# Patient Record
Sex: Male | Born: 1950 | Race: White | Hispanic: No | Marital: Married | Smoking: Never smoker
Health system: Southern US, Community
[De-identification: ages and names within clinical notes are randomized; demographics above are authoritative.]

## PROBLEM LIST (undated history)

## (undated) DIAGNOSIS — M1990 Unspecified osteoarthritis, unspecified site: Secondary | ICD-10-CM

## (undated) DIAGNOSIS — T7840XA Allergy, unspecified, initial encounter: Secondary | ICD-10-CM

## (undated) DIAGNOSIS — C61 Malignant neoplasm of prostate: Secondary | ICD-10-CM

## (undated) DIAGNOSIS — K219 Gastro-esophageal reflux disease without esophagitis: Secondary | ICD-10-CM

## (undated) HISTORY — PX: KNEE SURGERY: SHX244

## (undated) HISTORY — PX: PROSTATECTOMY: SHX69

## (undated) HISTORY — DX: Gastro-esophageal reflux disease without esophagitis: K21.9

## (undated) HISTORY — PX: COLONOSCOPY: SHX174

## (undated) HISTORY — PX: FLEXIBLE SIGMOIDOSCOPY: SHX1649

## (undated) HISTORY — DX: Allergy, unspecified, initial encounter: T78.40XA

## (undated) HISTORY — PX: UPPER GASTROINTESTINAL ENDOSCOPY: SHX188

## (undated) HISTORY — PX: POLYPECTOMY: SHX149

## (undated) HISTORY — DX: Malignant neoplasm of prostate: C61

## (undated) HISTORY — PX: UMBILICAL HERNIA REPAIR: SHX196

## (undated) HISTORY — PX: WISDOM TOOTH EXTRACTION: SHX21

---

## 2002-01-24 ENCOUNTER — Encounter: Payer: Self-pay | Admitting: Internal Medicine

## 2002-01-24 ENCOUNTER — Ambulatory Visit (HOSPITAL_COMMUNITY): Admission: RE | Admit: 2002-01-24 | Discharge: 2002-01-24 | Payer: Self-pay | Admitting: Internal Medicine

## 2002-10-30 ENCOUNTER — Ambulatory Visit (HOSPITAL_COMMUNITY): Admission: RE | Admit: 2002-10-30 | Discharge: 2002-10-30 | Payer: Self-pay | Admitting: Surgery

## 2007-06-24 ENCOUNTER — Ambulatory Visit (HOSPITAL_COMMUNITY): Admission: RE | Admit: 2007-06-24 | Discharge: 2007-06-24 | Payer: Self-pay | Admitting: Urology

## 2007-08-11 ENCOUNTER — Inpatient Hospital Stay (HOSPITAL_COMMUNITY): Admission: RE | Admit: 2007-08-11 | Discharge: 2007-08-12 | Payer: Self-pay | Admitting: Urology

## 2007-08-11 ENCOUNTER — Encounter (INDEPENDENT_AMBULATORY_CARE_PROVIDER_SITE_OTHER): Payer: Self-pay | Admitting: Urology

## 2010-02-08 ENCOUNTER — Ambulatory Visit: Payer: Self-pay | Admitting: Family Medicine

## 2010-02-08 DIAGNOSIS — H66009 Acute suppurative otitis media without spontaneous rupture of ear drum, unspecified ear: Secondary | ICD-10-CM | POA: Insufficient documentation

## 2010-02-08 DIAGNOSIS — H6120 Impacted cerumen, unspecified ear: Secondary | ICD-10-CM

## 2010-02-10 ENCOUNTER — Telehealth (INDEPENDENT_AMBULATORY_CARE_PROVIDER_SITE_OTHER): Payer: Self-pay | Admitting: *Deleted

## 2010-06-28 ENCOUNTER — Encounter: Payer: Self-pay | Admitting: Orthopaedic Surgery

## 2010-07-10 NOTE — Assessment & Plan Note (Signed)
Summary: Ear wax build up x 2 wks rm 3   Vital Signs:  Patient Profile:   60 Years Old Male CC:      Ears need to be cleaned Height:     66.5 inches Weight:      160 pounds O2 Sat:      100 % O2 treatment:    Room Air Temp:     97.9 degrees F oral Pulse rate:   83 / minute Pulse rhythm:   regular Resp:     16 per minute BP sitting:   122 / 79  (left arm) Cuff size:   regular  Vitals Entered By: Areta Haber CMA (February 08, 2010 11:51 AM)                  Current Allergies: No known allergies History of Present Illness Chief Complaint: Ears need to be cleaned History of Present Illness:  Subjective:  Patient complains of ears clogged for two weeks without pain; left worse than right  Current Problems: OTITIS MEDIA, SUPPURATIVE, ACUTE, LEFT (ICD-382.00) CERUMEN IMPACTION, BILATERAL (ICD-380.4) FAMILY HISTORY DIABETES 1ST DEGREE RELATIVE (ICD-V18.0)   Current Meds AMOXICILLIN 875 MG TABS (AMOXICILLIN) One by mouth two times a day  REVIEW OF SYSTEMS Constitutional Symptoms      Denies fever, chills, night sweats, weight loss, weight gain, and fatigue.  Eyes       Denies change in vision, eye pain, eye discharge, glasses, contact lenses, and eye surgery. Ear/Nose/Throat/Mouth       Complains of change in hearing and ear pain.      Denies hearing loss/aids, ear discharge, dizziness, frequent runny nose, frequent nose bleeds, sinus problems, sore throat, hoarseness, and tooth pain or bleeding.      Comments: L ear x 2 wks Respiratory       Denies dry cough, productive cough, wheezing, shortness of breath, asthma, bronchitis, and emphysema/COPD.  Cardiovascular       Denies murmurs, chest pain, and tires easily with exhertion.    Gastrointestinal       Denies stomach pain, nausea/vomiting, diarrhea, constipation, blood in bowel movements, and indigestion. Genitourniary       Denies painful urination, kidney stones, and loss of urinary control. Neurological     Denies paralysis, seizures, and fainting/blackouts. Musculoskeletal       Denies muscle pain, joint pain, joint stiffness, decreased range of motion, redness, swelling, muscle weakness, and gout.  Skin       Denies bruising, unusual mles/lumps or sores, and hair/skin or nail changes.  Psych       Denies mood changes, temper/anger issues, anxiety/stress, speech problems, depression, and sleep problems. Other Comments: Pt states he was advised to f/u w/ PCP by [facility] and job - he did not. Pt has not seen PCP for this. Pt states he put drops in last night.   Past History:  Past Medical History: Unremarkable  Past Surgical History: R Knee Hernia Prostatectomy Wisdom Teeth  Family History: Family History Diabetes 1st degree relative  Social History: Married Never Smoked Alcohol use-no Drug use-no Regular exercise-no Smoking Status:  never Drug Use:  no Does Patient Exercise:  no   Objective:  Appearance:  Patient appears healthy, stated age, and in no acute distress  Eyes:  Pupils are equal, round, and reactive to light and accomdation.  Extraocular movement is intact.  Conjunctivae are not inflamed.  Ears:  Canals occluded with cerumin bilaterally.  Post lavage:  canals normal.  Right tympanic membrane normal.  Left tympanic membrane erythematous Nose:  Minimal congestion Pharynx:  Normal  Neck:  Supple.  No adenopathy is present.  No thyromegaly is present  Assessment New Problems: OTITIS MEDIA, SUPPURATIVE, ACUTE, LEFT (ICD-382.00) CERUMEN IMPACTION, BILATERAL (ICD-380.4) FAMILY HISTORY DIABETES 1ST DEGREE RELATIVE (ICD-V18.0)   Plan New Medications/Changes: AMOXICILLIN 875 MG TABS (AMOXICILLIN) One by mouth two times a day  #14 x 0, 02/08/2010, Donna Christen MD  New Orders: Cerumen Impaction Removal [69210] New Patient Level III [99203] Planning Comments:   Begin amoxicillin for otitis media; may use Mucinex D for congestion Follow-up with PCP if not  improving.   The patient and/or caregiver has been counseled thoroughly with regard to medications prescribed including dosage, schedule, interactions, rationale for use, and possible side effects and they verbalize understanding.  Diagnoses and expected course of recovery discussed and will return if not improved as expected or if the condition worsens. Patient and/or caregiver verbalized understanding.  Prescriptions: AMOXICILLIN 875 MG TABS (AMOXICILLIN) One by mouth two times a day  #14 x 0   Entered and Authorized by:   Donna Christen MD   Signed by:   Donna Christen MD on 02/08/2010   Method used:   Print then Give to Patient   RxID:   [id number]   Orders Added: 1)  Cerumen Impaction Removal [73710] 2)  New Patient Level III [62694]

## 2010-07-10 NOTE — Progress Notes (Signed)
  Phone Note Outgoing Call   Call placed by: Lajean Saver RN,  February 10, 2010 12:02 PM Call placed to: Patient Action Taken: Phone Call Completed Summary of Call: Call back: patient states he is feeling well and his hearing is back to normal. He had no other questions or concerns

## 2010-10-21 NOTE — Op Note (Signed)
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   PROCEDURES:  1. Robotic assisted laparoscopic radical prostatectomy (left nerve      sparing).  2. Bilateral laparoscopic pelvic lymphadenectomy.   SURGEON:  Heloise Purpura, MD   ASSISTANT:  Excell Seltzer. Annabell Howells, M.D.   ANESTHESIA:  General.   COMPLICATIONS:  None.   ESTIMATED BLOOD LOSS:  150 mL.   SPECIMENS:  1. Prostate seminal vesicles.  2. Right pelvic lymph nodes.  3. Left pelvic lymph nodes.   DISPOSITION OF SPECIMENS:  To pathology.   DRAINS:  1. A 20-French straight catheter.  2. A #19 Blake pelvic drain.   INDICATIONS:  Mr. Curtis Hamilton is a 60 year old gentleman with clinically  localized adenocarcinoma of the prostate.  After discussion regarding  management options for treatment, the patient elected to proceed with  surgical therapy, and the above procedure.  Potential risks/benefits,  complications, and alternative treatment options were discussed with the  patient in detail and informed consent was obtained.   DESCRIPTION OF PROCEDURE:  The patient was taken to the operating room  and a general anesthetic was administered.  He was given preoperative  antibiotics, placed in the dorsal lithotomy position, and prepped and  draped in the usual sterile fashion.   Next, a preoperative time-out was performed.  A Foley catheter was then  inserted into the bladder, and a site was selected just to the left of  the  umbilicus for placement of the camera port.  This was placed using a  standard open Hassan technique.  This allowed entry into the peritoneal  cavity under direct vision and without difficulty.  A 0-degree lens was  then used to inspect the abdomen. There was no evidence for any intra-  abdominal injuries or other abnormalities.  The remaining ports were  then placed.  Bilateral 8-mm robotic ports were placed 10 cm lateral to  and just inferior to the camera port site.  An additional 8-mm robotic  port was placed in the far left lateral abdominal wall.  A 5-mm port was  placed between the camera port and the right robotic port.  An  additional 12-mm port was placed in the far right lateral abdominal wall  for laparoscopic assistance.  All ports were placed under direct vision  and without difficulty.   The surgical cart was then docked.  With the aid of the cautery  scissors, the bladder was reflected posteriorly allowing entry into  space of Retzius, and identification of the endopelvic fascia.  The  patient's mesh hernia repair was encountered, and the mesh extended  from  the pubic symphysis across the left side of the pelvis.  The bladder was  able to be easily dissected off the mesh for the most part, although  there were some adhesions toward the left lateral pelvic sidewall.  Once  the bladder was adequately mobilized, the endopelvic fascia was incised  from the apex back to the base of the prostate bilaterally, and the  underlying levator muscle fibers were swept laterally off the prostate.  This isolated the dorsal venous complex which was then stapled and  divided with a 45-mm Flexi-ETS stapler.  The bladder neck was identified  with the aid of Foley catheter manipulation and divided anteriorly.  The  Foley catheter balloon was deflated, and the catheter was brought into  the operative field and used to retract the prostate anteriorly.  This  exposed the posterior bladder neck,  and there was noted to be a small  median lobe.  This was enucleated and excised, and dissection proceeded  posteriorly between the bladder and prostate until the vasa deferentia  and seminal vesicles were identified.   The vasa deferentia were isolated, divided, and lifted anteriorly.  The  seminal vesicles were dissected down to their tips with care to control  the seminal vesicle and arterial blood supply.  The space Denonvilliers  fascia and the anterior rectum was then bluntly developed, and the  vascular pedicles of the prostate were thereby isolated.  The lateral  prostatic fascia on the left side of the prostate was sharply incised  allowing the neurovascular bundle to be released.  The vascular pedicle  of the prostate on the left side was then ligated with Hem-o-lok clips  above the level of the neurovascular bundle, and divided with sharp cold  scissor dissection.  The neurovascular bundle was then swept off the  apex of prostate on the left side.   On the right side, a wide non-nerve sparing procedure was performed.  Hem-o-lok clips were again used for ligation of the vascular pedicles.  The urethra was then sharply divided, allowing the specimen to be  disarticulated.  The pelvis was copiously irrigated.  There were noted  to be 2 areas along the left neurovascular bundle that did appear to  have some bleeding which was controlled with 3-0 Vicryl figure-of-eight  sutures.  There was no evidence for a rectal injury.   Attention then turned to the right pelvic sidewall and the fibrofatty  tissue between the external iliac vein, confluence of the iliac vessels,  hypogastric artery, and Cooper's ligament was dissected free from the  pelvic sidewall with care to preserve the obturator nerve.  Hem-o-lok  clips were used for lymphostasis and hemostasis.  This specimen was  passed off for permanent pathologic analysis.   On the left side an identical procedure was performed.  A  more limited  lymph node dissection; however, was performed due to the adhesions that  were noted as a result the patient's prior left mesh inguinal hernia  repair.  The specimen was passed off for permanent pathologic analysis,  and attention turned to the urethral anastomosis.  A 2-0 Vicryl slip-  knot was placed between the Denonvilliers fascia, the posterior urethra,  and the posterior bladder neck to reapproximate these structures.  A  double-armed 3-0 Monocryl suture was then used to perform a 360-degree  running, tension-free anastomosis.  A new 20-French Coude catheter was  inserted into the bladder and irrigated.  There were no blood clots  within the  bladder, and the anastomosis appeared be watertight.  A #19  Blake drain was brought through the left robotic port and appropriately  positioned in the pelvis.  It was secured to the skin with a nylon  suture.   The surgical cart was then undocked.  The right lateral 12-mm port site  was closed with a #0 Vicryl suture placed with the aid of the [employer].  All remaining ports were removed under direct vision, and the  prostate specimen was removed intact within the Endopouch retrieval bag  via the periumbilical port site.  This port site was then closed at the  fascial level with a running #0 Vicryl suture.  All port sites were  injected with 1/4% Marcaine and reapproximated at the skin level.  Sterile dressings were applied.  The patient appeared to have tolerated  the procedure well without complications.  He was able to be extubated,  and transferred to the recovery unit in satisfactory condition.      Heloise Purpura, MD  Electronically Signed     LB/MEDQ  D:  08/11/2007  T:  08/11/2007  Job:  [id number]

## 2010-10-24 NOTE — Op Note (Signed)
NAME:  Curtis Hamilton, Curtis Hamilton                         ACCOUNT NO.:  [id number]   MEDICAL RECORD NO.:  [id number]                   PATIENT TYPE:  AMB   LOCATION:  DAY                                  FACILITY:  [facility]   PHYSICIAN:  Abigail Miyamoto, M.D.              DATE OF BIRTH:  07/23/1950   DATE OF PROCEDURE:  10/30/2002  DATE OF DISCHARGE:                                 OPERATIVE REPORT   PREOPERATIVE DIAGNOSES:  1. Left inguinal hernia.  2. Umbilical hernia.   POSTOPERATIVE DIAGNOSES:  1. Left inguinal hernia.  2. Umbilical hernia.   OPERATION/PROCEDURE:  1. Laparoscopic repair of left inguinal hernia with mesh.  2. Umbilical hernia repair.   SURGEON:  Abigail Miyamoto, M.D.   ANESTHESIA:  General endotracheal anesthesia with 0.25% Marcaine   ESTIMATED BLOOD LOSS:  Minimal.   DESCRIPTION OF PROCEDURE:  The patient was brought to the operating room and  identified as Curtis Hamilton and he was placed supine on the operating table  and general anesthesia was induced.  His abdomen was then prepped and draped  in the usual sterile fashion.  Using a #15 blade, a small transverse  incision was made below the umbilicus.  The incision was carried down to the  fascia which was then opened with a scalpel.  The rectus muscle was then  identified and elevated.  The dissecting balloon was then passed underneath  the rectus muscle and manipulated toward the pubis.  The dissecting balloon  was then insufflated dissecting out the preperitoneal space. This was done  under direct vision.  At this point the dissecting balloon was removed and a  small port was placed.  Insufflation was then begun with carbon dioxide in  the preperitoneal space.  Next, two 5 mm ports were placed in the patient's  midline under direct vision.  The patient's inguinal area was easily  identified as well as the testicular cord structures, the epigastric vessels  and Cooper's ligament.  The patient was found to have  a small direct  inguinal hernia.  The testicular cord was examined and no indirect hernia  defect was identified.  At this point a piece of precut Prolene mesh from  [place] was brought to the field.  The mesh was placed through the port at the  umbilicus.  The mesh was then placed in overlay fashion of the testicular  cord structures as well as the hernia defect.  The mesh was then tacked in  place with a surgical tacker, tacking it to Cooper's ligament up the medial  abdominal wall and out laterally.  Excellent coverage of the hernia defect  appeared to be achieved.  At this point all ports were removed and the  preperitoneal spaces deflated.  The mesh appeared to lay appropriately as  the space collapsed.   Next, attention was turned toward the umbilical hernia.  Through the same  incision the hernia  sac was easily identified and separated from the  overlying umbilical skin.  The sac was then excised with the electrocautery.  The fascial defect was then closed with several figure-of-eight #1 Novofil  sutures.  __________ small fascial incision for the laparoscopic repair was  then closed with 0 Vicryl figure-of-eight suture as well.   At this point all incisions were anesthetized with 0.25% Marcaine and closed  with 4-0 Monocryl subcuticular sutures.  Steri-Strips, gauze and tape were  applied.  The patient tolerated the procedure well.  All sponge, needle and  instrument counts were correct at the end of the procedure.  The patient was  then extubated in the operating room and taken in stable condition to the  recovery room.                                               Abigail Miyamoto, M.D.    DB/MEDQ  D:  10/30/2002  T:  10/30/2002  Job:  [id number]

## 2011-02-27 LAB — CBC
HCT: 44.5
Hemoglobin: 15.5
RDW: 13.2

## 2011-02-27 LAB — BASIC METABOLIC PANEL
BUN: 15
CO2: 31
Calcium: 9.5
Chloride: 103
Creatinine, Ser: 0.94
GFR calc Af Amer: >60
GFR calc non Af Amer: >60
Glucose, Bld: 112 — ABNORMAL HIGH
Potassium: 4.6
Sodium: 140

## 2011-03-02 LAB — HEMOGLOBIN AND HEMATOCRIT, BLOOD
HCT: 38.6 — ABNORMAL LOW
Hemoglobin: 12.9 — ABNORMAL LOW

## 2011-03-02 LAB — TYPE AND SCREEN: Antibody Screen: NEGATIVE

## 2012-04-25 ENCOUNTER — Other Ambulatory Visit: Payer: Self-pay | Admitting: Gastroenterology

## 2012-10-24 ENCOUNTER — Encounter (HOSPITAL_BASED_OUTPATIENT_CLINIC_OR_DEPARTMENT_OTHER): Payer: Self-pay

## 2012-10-24 ENCOUNTER — Emergency Department (HOSPITAL_BASED_OUTPATIENT_CLINIC_OR_DEPARTMENT_OTHER)
Admission: EM | Admit: 2012-10-24 | Discharge: 2012-10-24 | Disposition: A | Discharge status: Home / Self Care | Payer: BC Managed Care – PPO | Attending: Emergency Medicine | Admitting: Emergency Medicine

## 2012-10-24 DIAGNOSIS — M533 Sacrococcygeal disorders, not elsewhere classified: Secondary | ICD-10-CM | POA: Insufficient documentation

## 2012-10-24 DIAGNOSIS — M461 Sacroiliitis, not elsewhere classified: Secondary | ICD-10-CM | POA: Insufficient documentation

## 2012-10-24 DIAGNOSIS — Z8739 Personal history of other diseases of the musculoskeletal system and connective tissue: Secondary | ICD-10-CM | POA: Insufficient documentation

## 2012-10-24 HISTORY — DX: Unspecified osteoarthritis, unspecified site: M19.90

## 2012-10-24 MED ORDER — PANTOPRAZOLE SODIUM 40 MG PO TBEC
40.0000 mg | DELAYED_RELEASE_TABLET | Freq: Once | ORAL | Status: AC
  Administered 2012-10-24: 40 mg via ORAL
  Filled 2012-10-24: qty 1

## 2012-10-24 MED ORDER — IBUPROFEN 800 MG PO TABS: ORAL_TABLET | ORAL | Status: DC

## 2012-10-24 MED ORDER — OXYCODONE-ACETAMINOPHEN 10-325 MG PO TABS: 1.0000 | ORAL_TABLET | ORAL | Status: DC | PRN

## 2012-10-24 MED ORDER — IBUPROFEN 800 MG PO TABS
800.0000 mg | ORAL_TABLET | Freq: Once | ORAL | Status: AC
  Administered 2012-10-24: 800 mg via ORAL
  Filled 2012-10-24: qty 1

## 2012-10-24 NOTE — ED Provider Notes (Signed)
History     CSN: [id number]  Arrival date & time 10/24/12  7846   First MD Initiated Contact with Patient 10/24/12 0423      Chief Complaint  Patient presents with  . Back Pain    (Consider location/radiation/quality/duration/timing/severity/associated sxs/prior treatment) HPI This is a 62 year old man who did a lot of hiking and climbing 3 days ago. He is here with 2 days of pain originating in his right sacroiliac joint and radiating around to his right groin. He has not noted any groin masses or scrotal masses. He has not had any abdominal pain. He denies any dark urine or urinary changes. He denies any numbness or weakness. He denies any falls or other frank trauma. The pain is moderate and worse with ambulation. He has had partial relief with over-the-counter ibuprofen.  Past Medical History  Diagnosis Date  . Arthritis     Past Surgical History  Procedure Laterality Date  . Prostatectomy      No family history on file.  History  Substance Use Topics  . Smoking status: Never Smoker   . Smokeless tobacco: Not on file  . Alcohol Use: Not on file      Review of Systems  All other systems reviewed and are negative.    Allergies  Skelaxin  Home Medications   Current Outpatient Rx  Name  Route  Sig  Dispense  Refill  . ibuprofen (ADVIL,MOTRIN) 200 MG tablet   Oral   Take 400 mg by mouth every 6 (six) hours as needed for pain.           BP 156/94  Pulse 85  Temp(Src) 98.8 F (37.1 C) (Oral)  Resp 16  SpO2 98%  Physical Exam General: Well-developed, well-nourished male in no acute distress; appearance consistent with age of record HENT: normocephalic, atraumatic Eyes: pupils equal round and reactive to light; extraocular muscles intact Neck: supple Heart: regular rate and rhythm Lungs: clear to auscultation bilaterally Abdomen: soft; nondistended; nontender; no masses or hepatosplenomegaly; bowel sounds present GU: Tanner 4 male, circumcised; no  testicular masses or tenderness; no hernias palpated Back: Mild right SI tenderness Extremities: No deformity; full range of motion Neurologic: Awake, alert and oriented; motor function intact in all extremities and symmetric; no facial droop; mildly antalgic gait Skin: Warm and dry Psychiatric: Normal mood and affect    ED Course  Procedures (including critical care time)     MDM          Hanley Seamen, MD 10/24/12 [phone]

## 2012-10-24 NOTE — ED Notes (Signed)
Patient here with 2 days of right lower back pain with radiation to groin. States that the discomfort started after trip to the mountains with hiking. Has been taking ibuprofen with some relief. Pain worse with ambulation

## 2012-10-24 NOTE — ED Notes (Signed)
MD at bedside. 

## 2015-01-31 ENCOUNTER — Encounter: Payer: Self-pay | Admitting: Emergency Medicine

## 2015-01-31 ENCOUNTER — Emergency Department
Admission: EM | Admit: 2015-01-31 | Discharge: 2015-01-31 | Disposition: A | Discharge status: Home / Self Care | Payer: BC Managed Care – PPO | Source: Home / Self Care | Attending: Family Medicine | Admitting: Family Medicine

## 2015-01-31 DIAGNOSIS — H6123 Impacted cerumen, bilateral: Secondary | ICD-10-CM

## 2015-01-31 MED ORDER — NEOMYCIN-POLYMYXIN-HC 3.5-10000-1 OT SUSP: 4.0000 [drp] | Freq: Three times a day (TID) | OTIC | Status: DC

## 2015-01-31 NOTE — ED Notes (Signed)
Bi-lateral cerumen  impaction 

## 2015-01-31 NOTE — Discharge Instructions (Signed)
° °  Ear Drops You have been diagnosed with a condition requiring you to put drops of medicine into your outer ear. HOME CARE INSTRUCTIONS   Put drops in the affected ear as instructed. After putting the drops in, you will need to lie down with the affected ear facing up for ten minutes so the drops will remain in the ear canal and run down and fill the canal. Continue using the ear drops for as long as directed by your health care provider.  Prior to getting up, put a cotton ball gently in your ear canal. Leave enough of the cotton ball out so it can be easily removed. Do not attempt to push this down into the canal with a cotton-tipped swab or other instrument.  Do not irrigate or wash out your ears if you have had a perforated eardrum or mastoid surgery, or unless instructed to do so by your health care provider.  Keep appointments with your health care provider as instructed.  Finish all medicine, or use for the length of time prescribed by your health care provider. Continue the drops even if your problem seems to be doing well after a couple days, or continue as instructed. SEEK MEDICAL CARE IF:  You become worse or develop increasing pain.  You notice any unusual drainage from your ear (particularly if the drainage has a bad smell).  You develop hearing difficulties.  You experience a serious form of dizziness in which you feel as if the room is spinning, and you feel nauseated (vertigo).  The outside of your ear becomes red or swollen or both. This may be a sign of an allergic reaction. MAKE SURE YOU:   Understand these instructions.  Will watch your condition.  Will get help right away if you are not doing well or get worse. Document Released: 05/19/2001 Document Revised: 05/30/2013 Document Reviewed: 12/20/2012 [facility] Patient Information 2015 [place], [place]. This information is not intended to replace advice given to you by your health care provider. Make sure you discuss  any questions you have with your health care provider.    Cerumen Impaction A cerumen impaction is when the wax in your ear forms a plug. This plug usually causes reduced hearing. Sometimes it also causes an earache or dizziness. Removing a cerumen impaction can be difficult and painful. The wax sticks to the ear canal. The canal is sensitive and bleeds easily. If you try to remove a heavy wax buildup with a cotton tipped swab, you may push it in further. Irrigation with water, suction, and small ear curettes may be used to clear out the wax. If the impaction is fixed to the skin in the ear canal, ear drops may be needed for a few days to loosen the wax. People who build up a lot of wax frequently can use ear wax removal products available in your local drugstore. SEEK MEDICAL CARE IF:  You develop an earache, increased hearing loss, or marked dizziness. Document Released: 07/02/2004 Document Revised: 08/17/2011 Document Reviewed: 08/22/2009 [facility] Patient Information 2015 [place], [place]. This information is not intended to replace advice given to you by your health care provider. Make sure you discuss any questions you have with your health care provider.

## 2015-01-31 NOTE — ED Provider Notes (Signed)
CSN: [id number]     Arrival date & time 01/31/15  1907 History   First MD Initiated Contact with Patient 01/31/15 1937     Chief Complaint  Patient presents with  . Cerumen Impaction      HPI Comments: Patient complains of recurrent sensation of ears clogged, left worse  The history is provided by the patient.    Past Medical History  Diagnosis Date  . Arthritis    Past Surgical History  Procedure Laterality Date  . Prostatectomy     No family history on file. Social History  Substance Use Topics  . Smoking status: Never Smoker   . Smokeless tobacco: Not on file  . Alcohol Use: Not on file    Review of Systems No sore throat No cough No pleuritic pain No wheezing No nasal congestion No post-nasal drainage No sinus pain/pressure No itchy/red eyes ? Earache; ears feel clogged No hemoptysis No SOB No fever/chills No nausea No vomiting No abdominal pain No diarrhea No urinary symptoms No skin rash No fatigue No myalgias No headache    Allergies  Skelaxin  Home Medications   Prior to Admission medications   Medication Sig Start Date End Date Taking? Authorizing Provider  ibuprofen (ADVIL,MOTRIN) 200 MG tablet Take 400 mg by mouth every 6 (six) hours as needed for pain.    Historical Provider, MD  ibuprofen (ADVIL,MOTRIN) 800 MG tablet Take 1 tablet every 8 hours as needed for back pain. Best taken with a meal. 10/24/12   Shanon Rosser, MD  oxyCODONE-acetaminophen (PERCOCET) 10-325 MG per tablet Take 1 tablet by mouth every 4 (four) hours as needed for pain. 10/24/12   Shanon Rosser, MD   Meds Ordered and Administered this Visit  Medications - No data to display  There were no vitals taken for this visit. No data found.   Physical Exam Nursing notes and Vital Signs reviewed. Appearance:  Patient appears stated age, and in no acute distress Eyes:  Pupils are equal, round, and reactive to light and accomodation.  Extraocular movement is intact.  Conjunctivae  are not inflamed  Nose:  Normal Ears:  Right canal almost completely occluded with cerumen.  Left canal completely occluded with cerumen.  Post lavage by nurse, canals appear mildly erythematous, otherwise normal.  Right tympanic membrane normal.  Left tympanic membrane has halo of erythema at periphery. Skin:  No rash present.   ED Course  Procedures  none       Lab Review: Tympanogram:  Normal both ears     MDM   1. Cerumen impaction, bilateral    Begin Cortisporin Otic Suspension for 4 to 5 days. Followup with Family Doctor if not improved in one week.      Kandra Nicolas, MD 02/02/15 1113

## 2017-10-19 ENCOUNTER — Other Ambulatory Visit: Payer: Self-pay | Admitting: Internal Medicine

## 2017-10-19 DIAGNOSIS — R748 Abnormal levels of other serum enzymes: Secondary | ICD-10-CM

## 2017-10-19 DIAGNOSIS — Z0000 Encounter for general adult medical examination without abnormal findings: Secondary | ICD-10-CM

## 2019-03-20 ENCOUNTER — Ambulatory Visit
Admission: RE | Admit: 2019-03-20 | Discharge: 2019-03-20 | Disposition: A | Discharge status: Home / Self Care | Payer: Medicare Other | Source: Ambulatory Visit | Attending: Internal Medicine | Admitting: Internal Medicine

## 2019-03-20 ENCOUNTER — Other Ambulatory Visit: Payer: Self-pay | Admitting: Internal Medicine

## 2019-03-20 DIAGNOSIS — M25571 Pain in right ankle and joints of right foot: Secondary | ICD-10-CM

## 2019-04-10 ENCOUNTER — Ambulatory Visit: Payer: Self-pay | Admitting: Cardiology

## 2019-05-08 DIAGNOSIS — Z7189 Other specified counseling: Secondary | ICD-10-CM | POA: Insufficient documentation

## 2019-05-08 NOTE — Progress Notes (Signed)
Cardiology Office Note   Date:  05/09/2019   ID:  Curtis Hamilton, DOB February 07, 1951, MRN [id number]  PCP:  Shon Baton, MD  Cardiologist:   No primary care provider on file. Referring:  Shon Baton, MD  Chief Complaint  Patient presents with  . Loss of Consciousness      History of Present Illness: Curtis Hamilton is a 68 y.o. male who presents for evaluation of syncope after referral from Shon Baton, MD.  The patient has no past cardiac history past cardiac history.  He did have presyncope some years ago.  However, he is otherwise been well.  He typically exercises by walking 1/2-hour every day.  He does some light weights.  He does not typically have any difficulties.  In October he got up to go to the bathroom which he does in the middle of the night.  He was sitting going to the bathroom he thinks when he had a frank syncopal episode.  The went down to the floor and injuring himself.  His wife was there apparently quickly.  She tried to get him up and he became presyncopal again.  She laid him on the floor.  He recovered very quickly.  He is not physically aggressive prior to this.  Since has not been getting up to use the toilet but is using a bedside urinal.  Is not feeling lightheaded.  I did review note from the primary care office in October.  He had normal orthostatics.  I reviewed an EKG which demonstrated no acute findings.  He otherwise has been doing well.  He denies any chest pressure, neck or arm discomfort.  He has not had any new shortness of breath, PND or orthopnea.  I do note the mention of his previous episode in 2016.   Past Medical History:  Diagnosis Date  . Arthritis   . Prostate cancer [facility])     Past Surgical History:  Procedure Laterality Date  . PROSTATECTOMY    . UMBILICAL HERNIA REPAIR       Current Outpatient Medications  Medication Sig Dispense Refill  . ibuprofen (ADVIL,MOTRIN) 200 MG tablet Take 400 mg by mouth every 6 (six) hours as needed  for pain.    Marland Kitchen ibuprofen (ADVIL,MOTRIN) 800 MG tablet Take 1 tablet every 8 hours as needed for back pain. Best taken with a meal. 30 tablet 0   No current facility-administered medications for this visit.     Allergies:   Skelaxin [metaxalone]    Social History:  The patient  reports that he has never smoked. He has never used smokeless tobacco.   Family History:  The patient's family history includes Cancer in his mother; Diabetes in his father.    ROS:  Please see the history of present illness.   Otherwise, review of systems are positive for none.   All other systems are reviewed and negative.    PHYSICAL EXAM: VS:  BP (!) 157/88   Pulse 100   Ht 5\' 7"  (1.702 m)   Wt 149 lb (67.6 kg)   BMI 23.34 kg/m  , BMI Body mass index is 23.34 kg/m. GENERAL:  Well appearing HEENT:  Pupils equal round and reactive, fundi not visualized, oral mucosa unremarkable NECK:  No jugular venous distention, waveform within normal limits, carotid upstroke brisk and symmetric, no bruits, no thyromegaly LYMPHATICS:  No cervical, inguinal adenopathy LUNGS:  Clear to auscultation bilaterally BACK:  No CVA tenderness CHEST:  Unremarkable HEART:  PMI  not displaced or sustained,S1 and S2 within normal limits, no S3, no S4, no clicks, no rubs, no murmurs ABD:  Flat, positive bowel sounds normal in frequency in pitch, no bruits, no rebound, no guarding, no midline pulsatile mass, no hepatomegaly, no splenomegaly EXT:  2 plus pulses throughout, no edema, no cyanosis no clubbing SKIN:  No rashes no nodules NEURO:  Cranial nerves II through XII grossly intact, motor grossly intact throughout PSYCH:  Cognitively intact, oriented to person place and time    EKG:  EKG is not ordered today. The ekg ordered 03/16/2019 demonstrates sinus rhythm, rate 97, axis within normal limits, intervals within normal limits, low voltage in the limb leads no acute ST-T wave changes.   Recent Labs: No results found for  requested labs within last 8760 hours.    Lipid Panel No results found for: CHOL, TRIG, HDL, CHOLHDL, VLDL, LDLCALC, LDLDIRECT    Wt Readings from Last 3 Encounters:  05/09/19 149 lb (67.6 kg)  01/31/15 163 lb (73.9 kg)      Other studies Reviewed: Additional studies/ records that were reviewed today include: Labs. Review of the above records demonstrates:  Please see elsewhere in the note.     ASSESSMENT AND PLAN:  SYNCOPE:   The patient had an episode of syncope as above.  He had an episode of this years before.  But otherwise he is well.  At this point I would agree that this was likely vasovagal.  I talked with the patient and his wife long time about this and if they have any recurrent complaints or symptoms of this happens again I want to know about this.  He is free to call me at home.  If he had further symptoms I will further investigate denies any monitoring or echocardiography is unlikely to be revealing at this point.  We talked about recognizing presyncopal symptoms and avoiding situations.  HTN: His blood pressure is elevated but he thinks this is unusual.  He will keep a blood pressure diary and send me these readings.  COVID EDUCATION: We talked about the vaccine.   Current medicines are reviewed at length with the patient today.  The patient does not have concerns regarding medicines.  The following changes have been made:  no change  Labs/ tests ordered today include: None  Orders Placed This Encounter  Procedures  . EKG 12-Lead     Disposition:   FU with me as needed.     Signed, Minus Breeding, MD  05/09/2019 5:24 PM    [place]

## 2019-05-09 ENCOUNTER — Encounter: Payer: Self-pay | Admitting: Cardiology

## 2019-05-09 ENCOUNTER — Other Ambulatory Visit: Payer: Self-pay

## 2019-05-09 ENCOUNTER — Ambulatory Visit: Payer: Medicare Other | Admitting: Cardiology

## 2019-05-09 VITALS — BP 157/88 | HR 100 | Ht 67.0 in | Wt 149.0 lb

## 2019-05-09 DIAGNOSIS — R55 Syncope and collapse: Secondary | ICD-10-CM | POA: Diagnosis not present

## 2019-05-09 DIAGNOSIS — Z7189 Other specified counseling: Secondary | ICD-10-CM

## 2019-05-09 NOTE — Patient Instructions (Signed)
Medication Instructions:  Your physician recommends that you continue on your current medications as directed. Please refer to the Current Medication list given to you today.  *If you need a refill on your cardiac medications before your next appointment, please call your pharmacy*  Lab Work: none If you have labs (blood work) drawn today and your tests are completely normal, you will receive your results only by: Marland Kitchen MyChart Message (if you have MyChart) OR . A paper copy in the mail If you have any lab test that is abnormal or we need to change your treatment, we will call you to review the results.  Testing/Procedures: none  Follow-Up: At [facility], you and your health needs are our priority.  As part of our continuing mission to provide you with exceptional heart care, we have created designated Provider Care Teams.  These Care Teams include your primary Cardiologist (physician) and Advanced Practice Providers (APPs -  Physician Assistants and Nurse Practitioners) who all work together to provide you with the care you need, when you need it.  Your next appointment:   AS NEEDED  The format for your next appointment:   Either In Person or Virtual  Provider:   You may see DR. HOCHREIN or one of the following Advanced Practice Providers on your designated Care Team:    Rosaria Ferries, PA-C  Jory Sims, DNP, ANP  Cadence Kathlen Mody, NP

## 2019-08-03 ENCOUNTER — Ambulatory Visit: Payer: BC Managed Care – PPO

## 2019-08-19 ENCOUNTER — Ambulatory Visit: Payer: Medicare PPO | Attending: Internal Medicine

## 2019-08-19 DIAGNOSIS — Z23 Encounter for immunization: Secondary | ICD-10-CM

## 2019-08-19 NOTE — Progress Notes (Signed)
   Covid-19 Vaccination Clinic  Name:  Curtis Hamilton    MRN: [id number] DOB: 11/27/1950  08/19/2019  [name] was observed post Covid-19 immunization for 15 minutes without incident. He was provided with Vaccine Information Sheet and instruction to access the V-Safe system.   [name] was instructed to call 911 with any severe reactions post vaccine: Marland Kitchen Difficulty breathing  . Swelling of face and throat  . A fast heartbeat  . A bad rash all over body  . Dizziness and weakness   Immunizations Administered    Name Date Dose VIS Date Route   Pfizer COVID-19 Vaccine 08/19/2019  3:50 PM 0.3 mL 05/19/2019 Intramuscular   Manufacturer: [place]   Lot: [id number]   [place]: [id number]

## 2019-09-13 ENCOUNTER — Ambulatory Visit: Payer: Medicare PPO | Attending: Internal Medicine

## 2019-09-13 DIAGNOSIS — Z23 Encounter for immunization: Secondary | ICD-10-CM

## 2019-09-13 NOTE — Progress Notes (Signed)
   Covid-19 Vaccination Clinic  Name:  Curtis Hamilton    MRN: [id number] DOB: January 04, 1951  09/13/2019  [name] was observed post Covid-19 immunization for 15 minutes without incident. He was provided with Vaccine Information Sheet and instruction to access the V-Safe system.   [name] was instructed to call 911 with any severe reactions post vaccine: Marland Kitchen Difficulty breathing  . Swelling of face and throat  . A fast heartbeat  . A bad rash all over body  . Dizziness and weakness   Immunizations Administered    Name Date Dose VIS Date Route   Pfizer COVID-19 Vaccine 09/13/2019 12:02 PM 0.3 mL 05/19/2019 Intramuscular   Manufacturer: [place]   Lot: [id number]   [place]: [id number]

## 2019-11-22 ENCOUNTER — Other Ambulatory Visit: Payer: Self-pay | Admitting: Internal Medicine

## 2019-11-22 DIAGNOSIS — E786 Lipoprotein deficiency: Secondary | ICD-10-CM

## 2020-03-16 DIAGNOSIS — Z23 Encounter for immunization: Secondary | ICD-10-CM | POA: Diagnosis not present

## 2020-04-23 ENCOUNTER — Ambulatory Visit: Payer: Medicare PPO | Attending: Internal Medicine

## 2020-04-23 ENCOUNTER — Other Ambulatory Visit (HOSPITAL_BASED_OUTPATIENT_CLINIC_OR_DEPARTMENT_OTHER): Payer: Self-pay | Admitting: Internal Medicine

## 2020-04-23 DIAGNOSIS — Z23 Encounter for immunization: Secondary | ICD-10-CM

## 2020-04-23 MED FILL — PFIZER-BIONTECH COVID-19 VA: 30 | 1 days supply | Qty: 0 | Fill #0

## 2020-04-23 NOTE — Progress Notes (Signed)
° °  Covid-19 Vaccination Clinic  Name:  Curtis Hamilton    MRN: [id number] DOB: [birth year]  04/23/2020  Mr. Matousek was observed post Covid-19 immunization for 15 minutes without incident. He was provided with Vaccine Information Sheet and instruction to access the V-Safe system.   Mr. Hanway was instructed to call 911 with any severe reactions post vaccine:  Difficulty breathing   Swelling of face and throat   A fast heartbeat   A bad rash all over body   Dizziness and weakness   Immunizations Administered    Name Date Dose VIS Date Route   Pfizer COVID-19 Vaccine 04/23/2020  1:21 PM 0.3 mL 03/27/2020 Intramuscular   Manufacturer: [place]   Lot: [id number]   [place]: [address]-5

## 2020-10-11 ENCOUNTER — Ambulatory Visit: Payer: Medicare PPO | Attending: Internal Medicine

## 2020-10-11 DIAGNOSIS — Z23 Encounter for immunization: Secondary | ICD-10-CM

## 2020-10-11 NOTE — Progress Notes (Signed)
   Covid-19 Vaccination Clinic  Name:  TRINIDAD PETRON    MRN: [id number] DOB: 01/24/1951  10/11/2020  Mr. Corales was observed post Covid-19 immunization for 15 minutes without incident. He was provided with Vaccine Information Sheet and instruction to access the V-Safe system.   Mr. Fore was instructed to call 911 with any severe reactions post vaccine: Marland Kitchen Difficulty breathing  . Swelling of face and throat  . A fast heartbeat  . A bad rash all over body  . Dizziness and weakness   Immunizations Administered    Name Date Dose VIS Date Route   PFIZER Comrnaty(Gray TOP) Covid-19 Vaccine 10/11/2020  1:29 PM 0.3 mL 05/16/2020 Intramuscular   Manufacturer: [employer]   Lot: [id number]   [place]: [phone]

## 2020-10-18 ENCOUNTER — Other Ambulatory Visit (HOSPITAL_BASED_OUTPATIENT_CLINIC_OR_DEPARTMENT_OTHER): Payer: Self-pay

## 2020-10-18 MED ORDER — PFIZER-BIONT COVID-19 VAC-TRIS 30 MCG/0.3ML IM SUSP
INTRAMUSCULAR | 0 refills | Status: AC
  Filled 2020-10-18: qty 0.3, 1d supply, fill #0

## 2020-11-19 DIAGNOSIS — E786 Lipoprotein deficiency: Secondary | ICD-10-CM | POA: Diagnosis not present

## 2020-11-19 DIAGNOSIS — Z125 Encounter for screening for malignant neoplasm of prostate: Secondary | ICD-10-CM | POA: Diagnosis not present

## 2020-11-19 DIAGNOSIS — R7301 Impaired fasting glucose: Secondary | ICD-10-CM | POA: Diagnosis not present

## 2020-11-19 DIAGNOSIS — Z0000 Encounter for general adult medical examination without abnormal findings: Secondary | ICD-10-CM | POA: Diagnosis not present

## 2020-11-26 DIAGNOSIS — R7301 Impaired fasting glucose: Secondary | ICD-10-CM | POA: Diagnosis not present

## 2020-11-26 DIAGNOSIS — C61 Malignant neoplasm of prostate: Secondary | ICD-10-CM | POA: Diagnosis not present

## 2020-11-26 DIAGNOSIS — F5221 Male erectile disorder: Secondary | ICD-10-CM | POA: Diagnosis not present

## 2020-11-26 DIAGNOSIS — M5136 Other intervertebral disc degeneration, lumbar region: Secondary | ICD-10-CM | POA: Diagnosis not present

## 2020-11-26 DIAGNOSIS — R55 Syncope and collapse: Secondary | ICD-10-CM | POA: Diagnosis not present

## 2020-11-26 DIAGNOSIS — E786 Lipoprotein deficiency: Secondary | ICD-10-CM | POA: Diagnosis not present

## 2020-11-26 DIAGNOSIS — M25571 Pain in right ankle and joints of right foot: Secondary | ICD-10-CM | POA: Diagnosis not present

## 2020-11-26 DIAGNOSIS — M1990 Unspecified osteoarthritis, unspecified site: Secondary | ICD-10-CM | POA: Diagnosis not present

## 2020-11-26 DIAGNOSIS — Z1212 Encounter for screening for malignant neoplasm of rectum: Secondary | ICD-10-CM | POA: Diagnosis not present

## 2020-11-26 DIAGNOSIS — R82998 Other abnormal findings in urine: Secondary | ICD-10-CM | POA: Diagnosis not present

## 2020-11-26 DIAGNOSIS — Z0000 Encounter for general adult medical examination without abnormal findings: Secondary | ICD-10-CM | POA: Diagnosis not present

## 2020-11-26 LAB — IFOBT (OCCULT BLOOD): IFOBT: NEGATIVE

## 2021-03-29 DIAGNOSIS — Z23 Encounter for immunization: Secondary | ICD-10-CM | POA: Diagnosis not present

## 2021-04-17 IMAGING — CR DG ANKLE COMPLETE 3+V*R*
3 series · 3 of 3 positions shown · non-contrast
Comparison: Bone scan 06/24/2007.

CLINICAL DATA: Right ankle sprain.

EXAM:
RIGHT ANKLE - COMPLETE 3+ VIEW

[t ankle joint ap right]
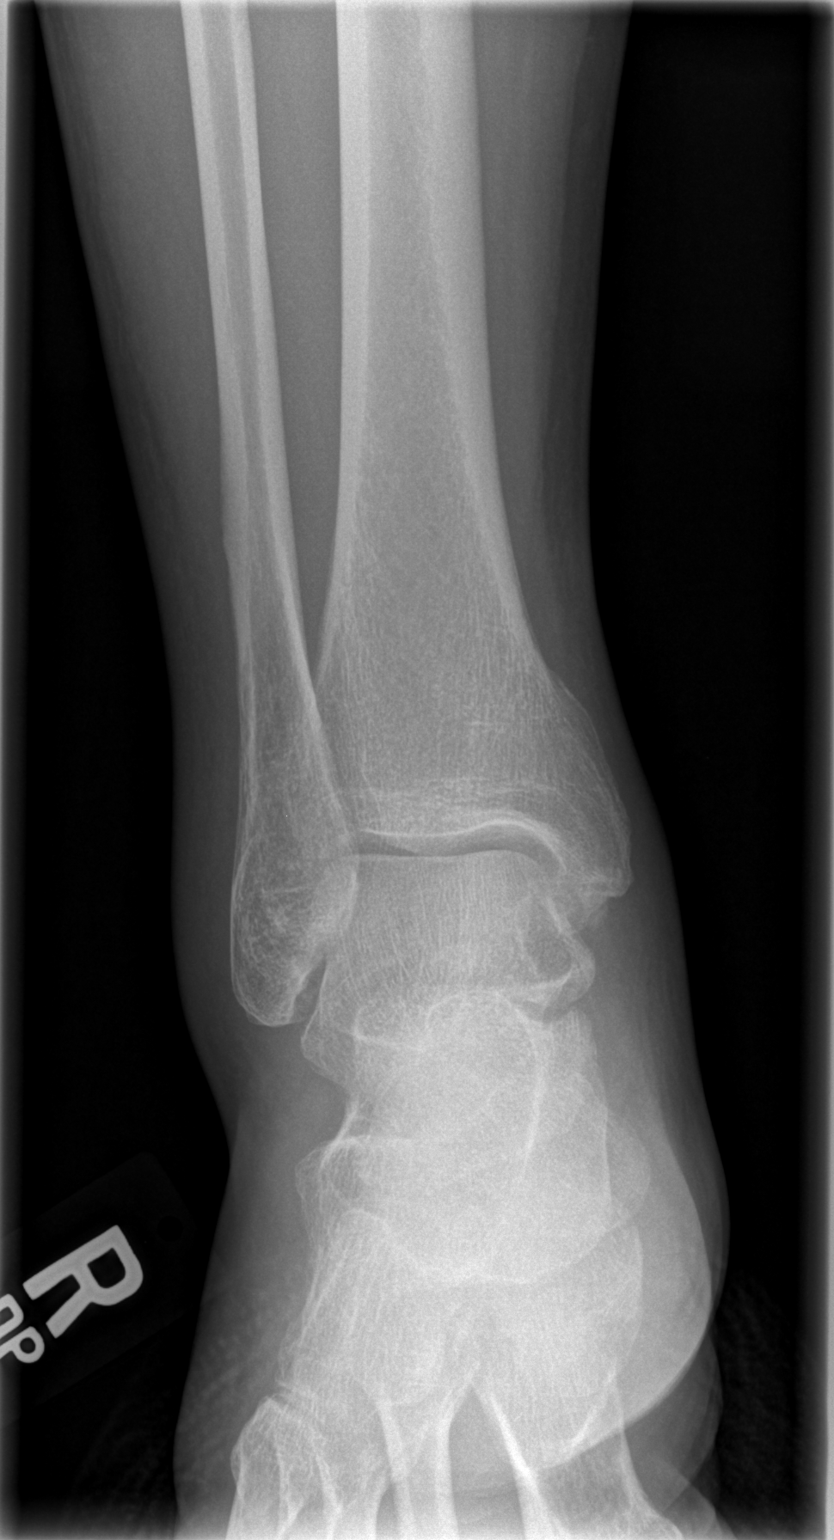

[t ankle joint oblique right]
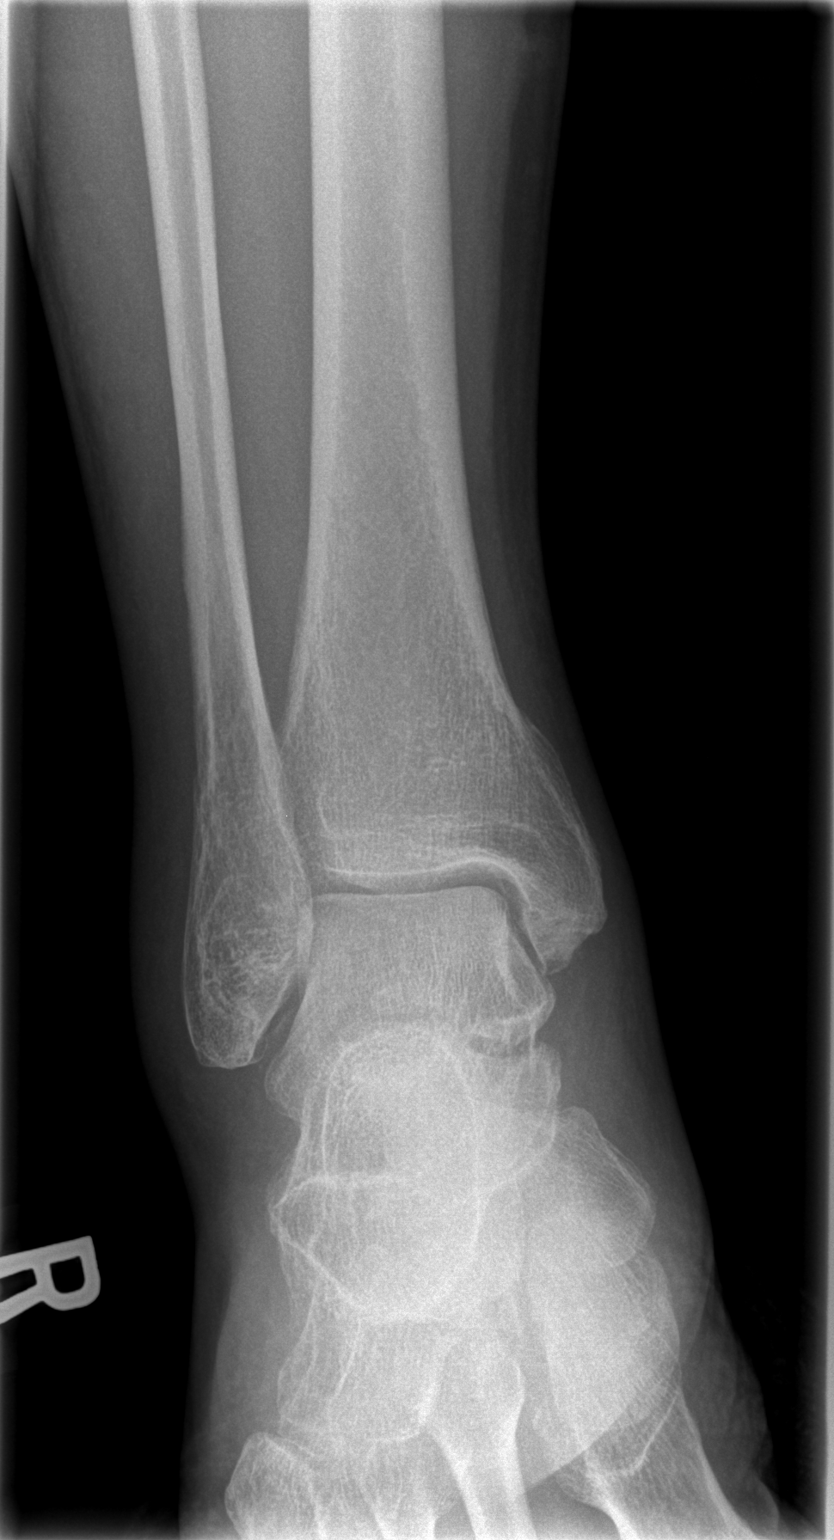

[t ankle joint lat right]
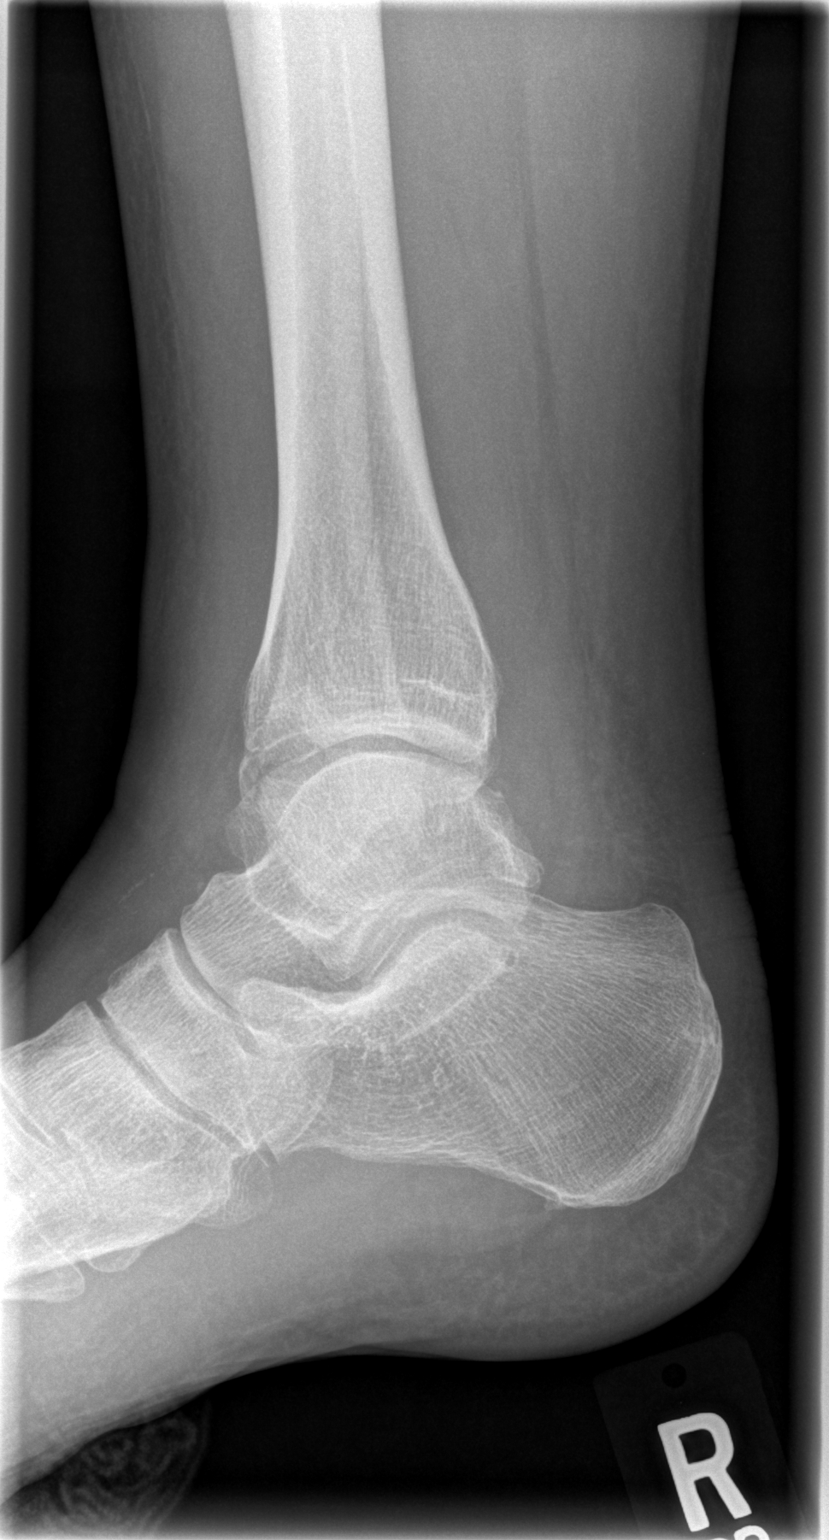

[3 of 3 positions shown; findings below may reference images not displayed]

FINDINGS: Diffuse soft tissue swelling. Subtle oblique lucency noted along the
distal fibula. This could represent a nondisplaced fracture. Tiny
bony densities noted adjacent to the medial and lateral malleoli.
These could be tiny avulsion fracture fragments. Diffuse
degenerative change.
IMPRESSION: 1. Diffuse soft tissue swelling. Subtle oblique lucency noted along
the distal fibula. This could represent a nondisplaced fracture.

2. Tiny bony densities noted adjacent to the medial and lateral
malleoli. These could represent tiny avulsion fracture fragments.

## 2021-06-04 ENCOUNTER — Ambulatory Visit: Payer: Medicare PPO | Attending: Internal Medicine

## 2021-06-04 DIAGNOSIS — Z23 Encounter for immunization: Secondary | ICD-10-CM

## 2021-06-04 NOTE — Progress Notes (Signed)
° °  Covid-19 Vaccination Clinic  Name:  MARTISE WADDELL    MRN: [id number] DOB: 11/30/1950  06/04/2021  Mr. Bresee was observed post Covid-19 immunization for 15 minutes without incident. He was provided with Vaccine Information Sheet and instruction to access the V-Safe system.   Mr. Burrows was instructed to call 911 with any severe reactions post vaccine: Difficulty breathing  Swelling of face and throat  A fast heartbeat  A bad rash all over body  Dizziness and weakness   Immunizations Administered     Name Date Dose VIS Date Route   Pfizer Covid-19 Vaccine Bivalent Booster 06/04/2021  2:19 PM 0.3 mL 02/05/2021 Intramuscular   Manufacturer: [place]   Lot: [id number]   [place]: [phone]

## 2021-06-06 ENCOUNTER — Other Ambulatory Visit (HOSPITAL_BASED_OUTPATIENT_CLINIC_OR_DEPARTMENT_OTHER): Payer: Self-pay

## 2021-06-06 MED ORDER — PFIZER COVID-19 VAC BIVALENT 30 MCG/0.3ML IM SUSP
INTRAMUSCULAR | 0 refills | Status: AC
  Filled 2021-06-06: qty 0.3, 1d supply, fill #0

## 2021-11-25 DIAGNOSIS — E786 Lipoprotein deficiency: Secondary | ICD-10-CM | POA: Diagnosis not present

## 2021-11-25 DIAGNOSIS — R7301 Impaired fasting glucose: Secondary | ICD-10-CM | POA: Diagnosis not present

## 2021-11-25 DIAGNOSIS — R7989 Other specified abnormal findings of blood chemistry: Secondary | ICD-10-CM | POA: Diagnosis not present

## 2021-11-25 DIAGNOSIS — Z125 Encounter for screening for malignant neoplasm of prostate: Secondary | ICD-10-CM | POA: Diagnosis not present

## 2021-11-26 DIAGNOSIS — Z0000 Encounter for general adult medical examination without abnormal findings: Secondary | ICD-10-CM | POA: Diagnosis not present

## 2021-12-02 ENCOUNTER — Other Ambulatory Visit: Payer: Self-pay | Admitting: Internal Medicine

## 2021-12-02 DIAGNOSIS — M1990 Unspecified osteoarthritis, unspecified site: Secondary | ICD-10-CM | POA: Diagnosis not present

## 2021-12-02 DIAGNOSIS — F5221 Male erectile disorder: Secondary | ICD-10-CM | POA: Diagnosis not present

## 2021-12-02 DIAGNOSIS — R55 Syncope and collapse: Secondary | ICD-10-CM | POA: Diagnosis not present

## 2021-12-02 DIAGNOSIS — M25571 Pain in right ankle and joints of right foot: Secondary | ICD-10-CM | POA: Diagnosis not present

## 2021-12-02 DIAGNOSIS — M5136 Other intervertebral disc degeneration, lumbar region: Secondary | ICD-10-CM | POA: Diagnosis not present

## 2021-12-02 DIAGNOSIS — Z0000 Encounter for general adult medical examination without abnormal findings: Secondary | ICD-10-CM | POA: Diagnosis not present

## 2021-12-02 DIAGNOSIS — R7301 Impaired fasting glucose: Secondary | ICD-10-CM | POA: Diagnosis not present

## 2021-12-02 DIAGNOSIS — E786 Lipoprotein deficiency: Secondary | ICD-10-CM | POA: Diagnosis not present

## 2021-12-02 DIAGNOSIS — R059 Cough, unspecified: Secondary | ICD-10-CM | POA: Diagnosis not present

## 2021-12-02 DIAGNOSIS — R82998 Other abnormal findings in urine: Secondary | ICD-10-CM | POA: Diagnosis not present

## 2022-02-02 ENCOUNTER — Other Ambulatory Visit: Payer: Medicare PPO

## 2022-02-11 ENCOUNTER — Telehealth: Payer: Self-pay | Admitting: Internal Medicine

## 2022-02-11 NOTE — Telephone Encounter (Signed)
Good Morning Dr. Henrene Pastor,  We received a referral for patient to have a screening colonoscopy. He was a former patient of Dr.Buccini, who is now retired. He is requesting his care to [place]. The patient asked for you specifically.   Patient had his last colonoscopy 04/25/2012. I am sending his last pathology report from the colonoscopy along with referral notes for you to review and advise on scheduling.  Thank you.

## 2022-02-16 NOTE — Telephone Encounter (Signed)
GI RECORDS REVIEWED  Asked to review this patient's records regarding transfer of care and follow-up colonoscopy.  Colonoscopy with Dr. Cristina Gong in 2013 revealed 3 adenomas.  Otherwise normal.  Follow-up in 3 years recommended.  Office notes from Dr. Virgina Jock suggest that the patient had his most recent colonoscopy with Dr. Cristina Gong in 2018 (no actual report available).  Due to history of multiple adenomatous follow-up would be due at this time (2023).  Okay to set up direct colonoscopy in [place] with me.  Docia Chuck. Geri [place]in[place]., M.D. [facility] Division of Gastroenterology

## 2022-02-20 ENCOUNTER — Ambulatory Visit (AMBULATORY_SURGERY_CENTER): Payer: Medicare PPO | Admitting: *Deleted

## 2022-02-20 ENCOUNTER — Encounter: Payer: Self-pay | Admitting: Internal Medicine

## 2022-02-20 VITALS — Ht 67.0 in | Wt 157.8 lb

## 2022-02-20 DIAGNOSIS — Z86010 Personal history of colonic polyps: Secondary | ICD-10-CM

## 2022-02-20 MED ORDER — NA SULFATE-K SULFATE-MG SULF 17.5-3.13-1.6 GM/177ML PO SOLN: 1.0000 | Freq: Once | ORAL | 0 refills | Status: AC

## 2022-02-20 NOTE — Progress Notes (Signed)
No egg or soy allergy known to patient  No issues known to pt with past sedation with any surgeries or procedures Patient denies ever being told they had issues or difficulty with intubation  No FH of Malignant Hyperthermia Pt is not on diet pills Pt is not on home 02  Pt is not on blood thinners  Pt denies issues with constipation  No A fib or A flutter Have any cardiac testing pending--NO Pt instructed to use Singlecare.com or GoodRx for a price reduction on prep   

## 2022-03-09 ENCOUNTER — Ambulatory Visit (AMBULATORY_SURGERY_CENTER): Payer: Medicare PPO | Admitting: Internal Medicine

## 2022-03-09 ENCOUNTER — Encounter: Payer: Self-pay | Admitting: Internal Medicine

## 2022-03-09 VITALS — BP 121/87 | HR 86 | Temp 97.7°F | Resp 12 | Ht 67.0 in | Wt 157.8 lb

## 2022-03-09 DIAGNOSIS — D125 Benign neoplasm of sigmoid colon: Secondary | ICD-10-CM | POA: Diagnosis not present

## 2022-03-09 DIAGNOSIS — M1990 Unspecified osteoarthritis, unspecified site: Secondary | ICD-10-CM | POA: Diagnosis not present

## 2022-03-09 DIAGNOSIS — Z09 Encounter for follow-up examination after completed treatment for conditions other than malignant neoplasm: Secondary | ICD-10-CM | POA: Diagnosis not present

## 2022-03-09 DIAGNOSIS — Z86010 Personal history of colonic polyps: Secondary | ICD-10-CM

## 2022-03-09 MED ORDER — SODIUM CHLORIDE 0.9 % IV SOLN: 500.0000 mL | INTRAVENOUS | Status: DC

## 2022-03-09 NOTE — Progress Notes (Signed)
Report to PACU, RN, vss, BBS= Clear.  

## 2022-03-09 NOTE — Progress Notes (Signed)
Called to room to assist during endoscopic procedure.  Patient ID and intended procedure confirmed with present staff. Received instructions for my participation in the procedure from the performing physician.  

## 2022-03-09 NOTE — Progress Notes (Signed)
HISTORY OF PRESENT ILLNESS:  Curtis Hamilton is a 71 y.o. male with a history of multiple adenomatous colon polyps on colonoscopy 2013 (Dr. Cristina Gong).  Apparently had follow-up in 2018 (no details).  Sent for surveillance colonoscopy  REVIEW OF SYSTEMS:  All non-GI ROS negative.  Past Medical History:  Diagnosis Date   Allergy    SEASONAL   Arthritis    GERD (gastroesophageal reflux disease)    " A LITTLE SOMETIMES"   Prostate cancer ([place])     Past Surgical History:  Procedure Laterality Date   COLONOSCOPY     X 3   FLEXIBLE SIGMOIDOSCOPY     X 2, NO SEDATION   KNEE SURGERY Right    MENCUIS   POLYPECTOMY     PROSTATECTOMY     UMBILICAL HERNIA REPAIR     UPPER GASTROINTESTINAL ENDOSCOPY     WISDOM TOOTH EXTRACTION      Social History Curtis Hamilton  reports that he has never smoked. He has been exposed to tobacco smoke. He has never used smokeless tobacco. He reports that he does not currently use alcohol. He reports that he does not use drugs.  family history includes Cancer in his mother; Diabetes in his father.  Allergies  Allergen Reactions   Skelaxin [Metaxalone] Palpitations       PHYSICAL EXAMINATION: Vital signs: BP (!) 153/95   Pulse 95   Temp 97.7 F (36.5 C)   Resp (!) 9   Ht '5\' 7"'$  (1.702 m)   Wt 157 lb 12.8 oz (71.6 kg)   SpO2 98%   BMI 24.71 kg/m  General: Well-developed, well-nourished, no acute distress HEENT: Sclerae are anicteric, conjunctiva pink. Oral mucosa intact Lungs: Clear Heart: Regular Abdomen: soft, nontender, nondistended, no obvious ascites, no peritoneal signs, normal bowel sounds. No organomegaly. Extremities: No edema Psychiatric: alert and oriented x3. Cooperative      ASSESSMENT:  Personal history of multiple adenomatous colon polyps.  Due for surveillance colonoscopy.  No contraindications   PLAN:  Surveillance colonoscopy

## 2022-03-09 NOTE — Op Note (Signed)
[place] Patient Name: Curtis Hamilton Procedure Date: 03/09/2022 3:37 PM MRN: [id number] Endoscopist: Docia Chuck. Henrene Pastor , MD Age: 71 Referring MD:  Date of Birth: 05-15-1951 Gender: Male Account #: [id number] Procedure:                Colonoscopy with cold snare polypectomy x 1 Indications:              High risk colon cancer surveillance: Personal                            history of multiple (3 or more) adenomas. Previous                            examinations with Dr. Cristina Gong, most recently 2013                            and 2018 Medicines:                Monitored Anesthesia Care Procedure:                Pre-Anesthesia Assessment:                           - Prior to the procedure, a History and Physical                            was performed, and patient medications and                            allergies were reviewed. The patient's tolerance of                            previous anesthesia was also reviewed. The risks                            and benefits of the procedure and the sedation                            options and risks were discussed with the patient.                            All questions were answered, and informed consent                            was obtained. Prior Anticoagulants: The patient has                            taken no previous anticoagulant or antiplatelet                            agents. ASA Grade Assessment: II - A patient with                            mild systemic disease. After reviewing the risks  and benefits, the patient was deemed in                            satisfactory condition to undergo the procedure.                           After obtaining informed consent, the colonoscope                            was passed under direct vision. Throughout the                            procedure, the patient's blood pressure, pulse, and                            oxygen saturations were  monitored continuously. The                            Colonoscope was introduced through the anus and                            advanced to the the cecum, identified by                            appendiceal orifice and ileocecal valve. The                            ileocecal valve, appendiceal orifice, and rectum                            were photographed. The quality of the bowel                            preparation was excellent. The colonoscopy was                            performed without difficulty. The patient tolerated                            the procedure well. The bowel preparation used was                            SUPREP via split dose instruction. Scope In: 3:57:01 PM Scope Out: 4:08:43 PM Scope Withdrawal Time: 0 hours 10 minutes 7 seconds  Total Procedure Duration: 0 hours 11 minutes 42 seconds  Findings:                 A 5 mm polyp was found in the sigmoid colon. The                            polyp was removed with a cold snare. Resection and                            retrieval were complete.  The exam was otherwise without abnormality on                            direct and retroflexion views. Small internal                            hemorrhoids noted. Complications:            No immediate complications. Estimated blood loss:                            None. Estimated Blood Loss:     Estimated blood loss: none. Impression:               - One 5 mm polyp in the sigmoid colon, removed with                            a cold snare. Resected and retrieved.                           - The examination was otherwise normal on direct                            and retroflexion views. Recommendation:           - Repeat colonoscopy in 5 years for surveillance                            (history of multiple adenomas).                           - Patient has a contact number available for                            emergencies. The signs  and symptoms of potential                            delayed complications were discussed with the                            patient. Return to normal activities tomorrow.                            Written discharge instructions were provided to the                            patient.                           - Resume previous diet.                           - Continue present medications.                           - Await pathology results. Docia Chuck. Henrene Pastor, MD 03/09/2022 4:13:46 PM This report has been signed electronically.

## 2022-03-09 NOTE — Patient Instructions (Signed)
Information on polyps given to you today.  Await pathology results.  Repeat colonoscopy in 5 years for surveillance to to history of multiple adenomas.  Resume previous diet and medications.   YOU HAD AN ENDOSCOPIC PROCEDURE TODAY AT [facility]:   Refer to the procedure report that was given to you for any specific questions about what was found during the examination.  If the procedure report does not answer your questions, please call your gastroenterologist to clarify.  If you requested that your care partner not be given the details of your procedure findings, then the procedure report has been included in a sealed envelope for you to review at your convenience later.  YOU SHOULD EXPECT: Some feelings of bloating in the abdomen. Passage of more gas than usual.  Walking can help get rid of the air that was put into your GI tract during the procedure and reduce the bloating. If you had a lower endoscopy (such as a colonoscopy or flexible sigmoidoscopy) you may notice spotting of blood in your stool or on the toilet paper. If you underwent a bowel prep for your procedure, you may not have a normal bowel movement for a few days.  Please Note:  You might notice some irritation and congestion in your nose or some drainage.  This is from the oxygen used during your procedure.  There is no need for concern and it should clear up in a day or so.  SYMPTOMS TO REPORT IMMEDIATELY:  Following lower endoscopy (colonoscopy or flexible sigmoidoscopy):  Excessive amounts of blood in the stool  Significant tenderness or worsening of abdominal pains  Swelling of the abdomen that is new, acute  Fever of 100F or higher   For urgent or emergent issues, a gastroenterologist can be reached at any hour by calling [phone]. Do not use MyChart messaging for urgent concerns.    DIET:  We do recommend a small meal at first, but then you may proceed to your regular diet.  Drink plenty of  fluids but you should avoid alcoholic beverages for 24 hours.  ACTIVITY:  You should plan to take it easy for the rest of today and you should NOT DRIVE or use heavy machinery until tomorrow (because of the sedation medicines used during the test).    FOLLOW UP: Our staff will call the number listed on your records the next business day following your procedure.  We will call around 7:15- 8:00 am to check on you and address any questions or concerns that you may have regarding the information given to you following your procedure. If we do not reach you, we will leave a message.     If any biopsies were taken you will be contacted by phone or by letter within the next 1-3 weeks.  Please call us at [phone] if you have not heard about the biopsies in 3 weeks.    SIGNATURES/CONFIDENTIALITY: You and/or your care partner have signed paperwork which will be entered into your electronic medical record.  These signatures attest to the fact that that the information above on your After Visit Summary has been reviewed and is understood.  Full responsibility of the confidentiality of this discharge information lies with you and/or your care-partner.

## 2022-03-10 ENCOUNTER — Telehealth: Payer: Self-pay | Admitting: *Deleted

## 2022-03-10 NOTE — Telephone Encounter (Signed)
Left message on f/u call 

## 2022-03-12 ENCOUNTER — Encounter: Payer: Self-pay | Admitting: Internal Medicine

## 2022-03-26 DIAGNOSIS — Z23 Encounter for immunization: Secondary | ICD-10-CM | POA: Diagnosis not present

## 2022-06-08 DIAGNOSIS — R6889 Other general symptoms and signs: Secondary | ICD-10-CM | POA: Diagnosis not present

## 2022-06-08 DIAGNOSIS — J101 Influenza due to other identified influenza virus with other respiratory manifestations: Secondary | ICD-10-CM | POA: Diagnosis not present

## 2022-06-08 DIAGNOSIS — R059 Cough, unspecified: Secondary | ICD-10-CM | POA: Diagnosis not present

## 2022-12-15 DIAGNOSIS — C61 Malignant neoplasm of prostate: Secondary | ICD-10-CM | POA: Diagnosis not present

## 2022-12-15 DIAGNOSIS — R7301 Impaired fasting glucose: Secondary | ICD-10-CM | POA: Diagnosis not present

## 2022-12-15 DIAGNOSIS — E786 Lipoprotein deficiency: Secondary | ICD-10-CM | POA: Diagnosis not present

## 2022-12-17 DIAGNOSIS — R059 Cough, unspecified: Secondary | ICD-10-CM | POA: Diagnosis not present

## 2022-12-17 DIAGNOSIS — M5136 Other intervertebral disc degeneration, lumbar region: Secondary | ICD-10-CM | POA: Diagnosis not present

## 2022-12-17 DIAGNOSIS — R7301 Impaired fasting glucose: Secondary | ICD-10-CM | POA: Diagnosis not present

## 2022-12-17 DIAGNOSIS — E786 Lipoprotein deficiency: Secondary | ICD-10-CM | POA: Diagnosis not present

## 2022-12-17 DIAGNOSIS — Z1331 Encounter for screening for depression: Secondary | ICD-10-CM | POA: Diagnosis not present

## 2022-12-17 DIAGNOSIS — Z8546 Personal history of malignant neoplasm of prostate: Secondary | ICD-10-CM | POA: Diagnosis not present

## 2022-12-17 DIAGNOSIS — Z0000 Encounter for general adult medical examination without abnormal findings: Secondary | ICD-10-CM | POA: Diagnosis not present

## 2022-12-17 DIAGNOSIS — R82998 Other abnormal findings in urine: Secondary | ICD-10-CM | POA: Diagnosis not present

## 2022-12-17 DIAGNOSIS — M1990 Unspecified osteoarthritis, unspecified site: Secondary | ICD-10-CM | POA: Diagnosis not present

## 2023-01-12 DIAGNOSIS — H2513 Age-related nuclear cataract, bilateral: Secondary | ICD-10-CM | POA: Diagnosis not present

## 2023-01-12 DIAGNOSIS — H5203 Hypermetropia, bilateral: Secondary | ICD-10-CM | POA: Diagnosis not present

## 2024-01-03 DIAGNOSIS — K219 Gastro-esophageal reflux disease without esophagitis: Secondary | ICD-10-CM | POA: Diagnosis not present

## 2024-01-03 DIAGNOSIS — C61 Malignant neoplasm of prostate: Secondary | ICD-10-CM | POA: Diagnosis not present

## 2024-01-03 DIAGNOSIS — E786 Lipoprotein deficiency: Secondary | ICD-10-CM | POA: Diagnosis not present

## 2024-01-03 DIAGNOSIS — Z1212 Encounter for screening for malignant neoplasm of rectum: Secondary | ICD-10-CM | POA: Diagnosis not present

## 2024-01-10 DIAGNOSIS — C61 Malignant neoplasm of prostate: Secondary | ICD-10-CM | POA: Diagnosis not present

## 2024-01-10 DIAGNOSIS — Z0000 Encounter for general adult medical examination without abnormal findings: Secondary | ICD-10-CM | POA: Diagnosis not present

## 2024-01-10 DIAGNOSIS — K648 Other hemorrhoids: Secondary | ICD-10-CM | POA: Diagnosis not present

## 2024-01-10 DIAGNOSIS — Z23 Encounter for immunization: Secondary | ICD-10-CM | POA: Diagnosis not present

## 2024-01-10 DIAGNOSIS — R82998 Other abnormal findings in urine: Secondary | ICD-10-CM | POA: Diagnosis not present

## 2024-01-10 DIAGNOSIS — D229 Melanocytic nevi, unspecified: Secondary | ICD-10-CM | POA: Diagnosis not present

## 2024-01-10 DIAGNOSIS — H6123 Impacted cerumen, bilateral: Secondary | ICD-10-CM | POA: Diagnosis not present

## 2024-01-10 DIAGNOSIS — M51369 Other intervertebral disc degeneration, lumbar region without mention of lumbar back pain or lower extremity pain: Secondary | ICD-10-CM | POA: Diagnosis not present

## 2024-01-10 DIAGNOSIS — R059 Cough, unspecified: Secondary | ICD-10-CM | POA: Diagnosis not present

## 2024-01-10 DIAGNOSIS — E786 Lipoprotein deficiency: Secondary | ICD-10-CM | POA: Diagnosis not present

## 2024-03-22 DIAGNOSIS — Z23 Encounter for immunization: Secondary | ICD-10-CM | POA: Diagnosis not present
